# Patient Record
Sex: Male | Born: 1995 | Race: White | Hispanic: No | Marital: Single | Smoking: Never smoker
Health system: Southern US, Community
[De-identification: ages and names within clinical notes are randomized; demographics above are authoritative.]

## PROBLEM LIST (undated history)

## (undated) DIAGNOSIS — E079 Disorder of thyroid, unspecified: Secondary | ICD-10-CM

## (undated) DIAGNOSIS — R1115 Cyclical vomiting syndrome unrelated to migraine: Secondary | ICD-10-CM

## (undated) DIAGNOSIS — Z9109 Other allergy status, other than to drugs and biological substances: Secondary | ICD-10-CM

## (undated) HISTORY — PX: ADENOIDECTOMY: SUR15

## (undated) HISTORY — PX: TYMPANOSTOMY TUBE PLACEMENT: SHX32

---
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
## 2021-07-01 ENCOUNTER — Emergency Department (HOSPITAL_BASED_OUTPATIENT_CLINIC_OR_DEPARTMENT_OTHER): Payer: No Typology Code available for payment source

## 2021-07-01 ENCOUNTER — Emergency Department (HOSPITAL_BASED_OUTPATIENT_CLINIC_OR_DEPARTMENT_OTHER)
Admission: EM | Admit: 2021-07-01 | Discharge: 2021-07-01 | Disposition: A | Discharge status: Home / Self Care | Payer: No Typology Code available for payment source | Attending: Emergency Medicine | Admitting: Emergency Medicine

## 2021-07-01 ENCOUNTER — Encounter (HOSPITAL_BASED_OUTPATIENT_CLINIC_OR_DEPARTMENT_OTHER): Payer: Self-pay

## 2021-07-01 ENCOUNTER — Other Ambulatory Visit: Payer: Self-pay

## 2021-07-01 DIAGNOSIS — S6991XA Unspecified injury of right wrist, hand and finger(s), initial encounter: Secondary | ICD-10-CM | POA: Diagnosis present

## 2021-07-01 DIAGNOSIS — Y92410 Unspecified street and highway as the place of occurrence of the external cause: Secondary | ICD-10-CM | POA: Insufficient documentation

## 2021-07-01 DIAGNOSIS — J45909 Unspecified asthma, uncomplicated: Secondary | ICD-10-CM | POA: Insufficient documentation

## 2021-07-01 DIAGNOSIS — S63501A Unspecified sprain of right wrist, initial encounter: Secondary | ICD-10-CM

## 2021-07-01 MED ORDER — IBUPROFEN 800 MG PO TABS
800.0000 mg | ORAL_TABLET | Freq: Once | ORAL | Status: AC
  Administered 2021-07-01: 800 mg via ORAL
  Filled 2021-07-01: qty 1
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
## 2021-07-01 NOTE — Discharge Instructions (Signed)
Wear wrist splint as applied for the next several days, then slowly attempted to reintroduce activity. ? ?Ice for 20 minutes every 2 hours while awake for the next 2 days. ? ?Elevate your wrist. ? ?Take ibuprofen 600 mg every 6 hours as needed for pain. ? ?Follow-up with your primary doctor if not improving in the next 1 to 2 weeks. ?
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
